# Patient Record
Sex: Male | Born: 1985 | Race: White | Hispanic: No | Marital: Single | Smoking: Current every day smoker
Health system: Southern US, Community
[De-identification: ages and names within clinical notes are randomized; demographics above are authoritative.]

## PROBLEM LIST (undated history)

## (undated) DIAGNOSIS — F1910 Other psychoactive substance abuse, uncomplicated: Secondary | ICD-10-CM

## (undated) DIAGNOSIS — K219 Gastro-esophageal reflux disease without esophagitis: Secondary | ICD-10-CM

## (undated) HISTORY — PX: FINGER SURGERY: SHX640

---
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
## 2008-06-30 ENCOUNTER — Emergency Department (HOSPITAL_COMMUNITY): Admission: EM | Admit: 2008-06-30 | Discharge: 2008-06-30 | Payer: Self-pay | Admitting: Emergency Medicine

## 2008-08-17 ENCOUNTER — Emergency Department (HOSPITAL_COMMUNITY): Admission: EM | Admit: 2008-08-17 | Discharge: 2008-08-17 | Payer: Self-pay | Admitting: Emergency Medicine

## 2009-06-30 ENCOUNTER — Emergency Department (HOSPITAL_COMMUNITY): Admission: EM | Admit: 2009-06-30 | Discharge: 2009-06-30 | Payer: Self-pay

## 2009-10-29 ENCOUNTER — Ambulatory Visit: Payer: Self-pay | Admitting: Diagnostic Radiology

## 2009-10-29 ENCOUNTER — Emergency Department (HOSPITAL_BASED_OUTPATIENT_CLINIC_OR_DEPARTMENT_OTHER): Admission: EM | Admit: 2009-10-29 | Discharge: 2009-10-29 | Payer: Self-pay | Admitting: Emergency Medicine

## 2009-11-05 ENCOUNTER — Emergency Department (HOSPITAL_BASED_OUTPATIENT_CLINIC_OR_DEPARTMENT_OTHER): Admission: EM | Admit: 2009-11-05 | Discharge: 2009-11-05 | Payer: Self-pay | Admitting: Emergency Medicine

## 2009-11-23 ENCOUNTER — Ambulatory Visit: Payer: Self-pay | Admitting: Diagnostic Radiology

## 2009-11-23 ENCOUNTER — Emergency Department (HOSPITAL_BASED_OUTPATIENT_CLINIC_OR_DEPARTMENT_OTHER): Admission: EM | Admit: 2009-11-23 | Discharge: 2009-11-23 | Payer: Self-pay | Admitting: Emergency Medicine
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
## 2011-03-19 LAB — DIFFERENTIAL
Basophils Absolute: 0 10*3/uL (ref 0.0–0.1)
Eosinophils Absolute: 0.1 10*3/uL (ref 0.0–0.7)
Eosinophils Relative: 2 % (ref 0–5)
Monocytes Absolute: 0.5 10*3/uL (ref 0.1–1.0)

## 2011-03-19 LAB — BASIC METABOLIC PANEL
BUN: 12 mg/dL (ref 6–23)
CO2: 32 mEq/L (ref 19–32)
Chloride: 100 mEq/L (ref 96–112)
Glucose, Bld: 65 mg/dL — ABNORMAL LOW (ref 70–99)
Potassium: 3.8 mEq/L (ref 3.5–5.1)

## 2011-03-19 LAB — CULTURE, BLOOD (ROUTINE X 2)

## 2011-03-19 LAB — CBC
HCT: 44.3 % (ref 39.0–52.0)
Hemoglobin: 14.6 g/dL (ref 13.0–17.0)
MCV: 89 fL (ref 78.0–100.0)
Platelets: 315 10*3/uL (ref 150–400)
RDW: 13.1 % (ref 11.5–15.5)

## 2011-03-20 LAB — CBC
MCV: 88 fL (ref 78.0–100.0)
Platelets: 354 10*3/uL (ref 150–400)
RBC: 5.16 MIL/uL (ref 4.22–5.81)
WBC: 8.5 10*3/uL (ref 4.0–10.5)

## 2011-03-20 LAB — BASIC METABOLIC PANEL
BUN: 15 mg/dL (ref 6–23)
Chloride: 101 mEq/L (ref 96–112)
Creatinine, Ser: 0.8 mg/dL (ref 0.4–1.5)
GFR calc Af Amer: >60 mL/min (ref >60)
GFR calc non Af Amer: >60 mL/min (ref >60)
Potassium: 3.8 mEq/L (ref 3.5–5.1)

## 2011-03-20 LAB — DIFFERENTIAL
Eosinophils Absolute: 0.1 10*3/uL (ref 0.0–0.7)
Lymphocytes Relative: 32 % (ref 12–46)
Lymphs Abs: 2.7 10*3/uL (ref 0.7–4.0)
Monocytes Relative: 5 % (ref 3–12)
Neutrophils Relative %: 61 % (ref 43–77)

## 2011-03-24 LAB — POCT I-STAT, CHEM 8
BUN: 6 mg/dL (ref 6–23)
Chloride: 107 meq/L (ref 96–112)
Creatinine, Ser: 1.1 mg/dL (ref 0.4–1.5)
Glucose, Bld: 79 mg/dL (ref 70–99)
Potassium: 3.5 meq/L (ref 3.5–5.1)
Sodium: 143 meq/L (ref 135–145)

## 2011-12-18 ENCOUNTER — Encounter: Payer: Self-pay | Admitting: Gastroenterology

## 2012-01-06 ENCOUNTER — Ambulatory Visit: Payer: Self-pay | Admitting: Gastroenterology

## 2014-08-23 ENCOUNTER — Emergency Department (HOSPITAL_BASED_OUTPATIENT_CLINIC_OR_DEPARTMENT_OTHER)
Admission: EM | Admit: 2014-08-23 | Discharge: 2014-08-23 | Disposition: A | Discharge status: Home / Self Care | Payer: BC Managed Care – PPO | Attending: Emergency Medicine | Admitting: Emergency Medicine

## 2014-08-23 ENCOUNTER — Encounter (HOSPITAL_BASED_OUTPATIENT_CLINIC_OR_DEPARTMENT_OTHER): Payer: Self-pay | Admitting: Emergency Medicine

## 2014-08-23 DIAGNOSIS — S29012A Strain of muscle and tendon of back wall of thorax, initial encounter: Secondary | ICD-10-CM

## 2014-08-23 DIAGNOSIS — IMO0002 Reserved for concepts with insufficient information to code with codable children: Secondary | ICD-10-CM | POA: Insufficient documentation

## 2014-08-23 DIAGNOSIS — Y9389 Activity, other specified: Secondary | ICD-10-CM | POA: Insufficient documentation

## 2014-08-23 DIAGNOSIS — X500XXA Overexertion from strenuous movement or load, initial encounter: Secondary | ICD-10-CM | POA: Diagnosis not present

## 2014-08-23 DIAGNOSIS — R000 Tachycardia, unspecified: Secondary | ICD-10-CM | POA: Diagnosis not present

## 2014-08-23 DIAGNOSIS — F17200 Nicotine dependence, unspecified, uncomplicated: Secondary | ICD-10-CM | POA: Diagnosis not present

## 2014-08-23 DIAGNOSIS — S39012A Strain of muscle, fascia and tendon of lower back, initial encounter: Secondary | ICD-10-CM

## 2014-08-23 DIAGNOSIS — K219 Gastro-esophageal reflux disease without esophagitis: Secondary | ICD-10-CM | POA: Diagnosis not present

## 2014-08-23 DIAGNOSIS — Y929 Unspecified place or not applicable: Secondary | ICD-10-CM | POA: Diagnosis not present

## 2014-08-23 DIAGNOSIS — S239XXA Sprain of unspecified parts of thorax, initial encounter: Secondary | ICD-10-CM | POA: Insufficient documentation

## 2014-08-23 DIAGNOSIS — Z79899 Other long term (current) drug therapy: Secondary | ICD-10-CM | POA: Diagnosis not present

## 2014-08-23 DIAGNOSIS — S335XXA Sprain of ligaments of lumbar spine, initial encounter: Secondary | ICD-10-CM | POA: Diagnosis not present

## 2014-08-23 DIAGNOSIS — S233XXA Sprain of ligaments of thoracic spine, initial encounter: Secondary | ICD-10-CM

## 2014-08-23 HISTORY — DX: Gastro-esophageal reflux disease without esophagitis: K21.9

## 2014-08-23 MED ORDER — TRAMADOL HCL 50 MG PO TABS: 50.0000 mg | ORAL_TABLET | Freq: Four times a day (QID) | ORAL | Status: DC | PRN

## 2014-08-23 MED ORDER — DIAZEPAM 5 MG PO TABS
5.0000 mg | ORAL_TABLET | Freq: Once | ORAL | Status: AC
  Administered 2014-08-23: 5 mg via ORAL
  Filled 2014-08-23: qty 1

## 2014-08-23 MED ORDER — OXYCODONE-ACETAMINOPHEN 5-325 MG PO TABS
1.0000 | ORAL_TABLET | Freq: Once | ORAL | Status: AC
  Administered 2014-08-23: 1 via ORAL
  Filled 2014-08-23: qty 1

## 2014-08-23 MED ORDER — DIAZEPAM 5 MG PO TABS: 5.0000 mg | ORAL_TABLET | Freq: Two times a day (BID) | ORAL | Status: DC | PRN

## 2014-08-23 NOTE — ED Notes (Signed)
Pain in mid back x1 hour while moving furniture.

## 2014-08-23 NOTE — Discharge Instructions (Signed)
Take Valium as needed as directed for muscle spasm. No driving or operating heavy machinery while taking valium. This medication may cause drowsiness. Take Tylenol as directed for pain. Rest, avoid heavy lifting or hard physical activity for the next few days. Apply an ice pack and heating pad intermittently.  Muscle Strain A muscle strain is an injury that occurs when a muscle is stretched beyond its normal length. Usually a small number of muscle fibers are torn when this happens. Muscle strain is rated in degrees. First-degree strains have the least amount of muscle fiber tearing and pain. Second-degree and third-degree strains have increasingly more tearing and pain.  Usually, recovery from muscle strain takes 1-2 weeks. Complete healing takes 5-6 weeks.  CAUSES  Muscle strain happens when a sudden, violent force placed on a muscle stretches it too far. This may occur with lifting, sports, or a fall.  RISK FACTORS Muscle strain is especially common in athletes.  SIGNS AND SYMPTOMS At the site of the muscle strain, there may be:  Pain.  Bruising.  Swelling.  Difficulty using the muscle due to pain or lack of normal function. DIAGNOSIS  Your health care provider will perform a physical exam and ask about your medical history. TREATMENT  Often, the best treatment for a muscle strain is resting, icing, and applying cold compresses to the injured area.  HOME CARE INSTRUCTIONS   Use the PRICE method of treatment to promote muscle healing during the first 2-3 days after your injury. The PRICE method involves:  Protecting the muscle from being injured again.  Restricting your activity and resting the injured body part.  Icing your injury. To do this, put ice in a plastic bag. Place a towel between your skin and the bag. Then, apply the ice and leave it on from 15-20 minutes each hour. After the third day, switch to moist heat packs.  Apply compression to the injured area with a splint  or elastic bandage. Be careful not to wrap it too tightly. This may interfere with blood circulation or increase swelling.  Elevate the injured body part above the level of your heart as often as you can.  Only take over-the-counter or prescription medicines for pain, discomfort, or fever as directed by your health care provider.  Warming up prior to exercise helps to prevent future muscle strains. SEEK MEDICAL CARE IF:   You have increasing pain or swelling in the injured area.  You have numbness, tingling, or a significant loss of strength in the injured area. MAKE SURE YOU:   Understand these instructions.  Will watch your condition.  Will get help right away if you are not doing well or get worse. Document Released: 12/02/2005 Document Revised: 09/22/2013 Document Reviewed: 07/01/2013 [facility] Patient Information 2015 [place], [place]. This information is not intended to replace advice given to you by your health care provider. Make sure you discuss any questions you have with your health care provider.  Lumbosacral Strain Lumbosacral strain is a strain of any of the parts that make up your lumbosacral vertebrae. Your lumbosacral vertebrae are the bones that make up the lower third of your backbone. Your lumbosacral vertebrae are held together by muscles and tough, fibrous tissue (ligaments).  CAUSES  A sudden blow to your back can cause lumbosacral strain. Also, anything that causes an excessive stretch of the muscles in the low back can cause this strain. This is typically seen when people exert themselves strenuously, fall, lift heavy objects, bend, or crouch repeatedly.  RISK FACTORS  Physically demanding work.  Participation in pushing or pulling sports or sports that require a sudden twist of the back (tennis, golf, baseball).  Weight lifting.  Excessive lower back curvature.  Forward-tilted pelvis.  Weak back or abdominal muscles or both.  Tight hamstrings. SIGNS AND  SYMPTOMS  Lumbosacral strain may cause pain in the area of your injury or pain that moves (radiates) down your leg.  DIAGNOSIS Your health care provider can often diagnose lumbosacral strain through a physical exam. In some cases, you may need tests such as X-ray exams.  TREATMENT  Treatment for your lower back injury depends on many factors that your clinician will have to evaluate. However, most treatment will include the use of anti-inflammatory medicines. HOME CARE INSTRUCTIONS   Avoid hard physical activities (tennis, racquetball, waterskiing) if you are not in proper physical condition for it. This may aggravate or create problems.  If you have a back problem, avoid sports requiring sudden body movements. Swimming and walking are generally safer activities.  Maintain good posture.  Maintain a healthy weight.  For acute conditions, you may put ice on the injured area.  Put ice in a plastic bag.  Place a towel between your skin and the bag.  Leave the ice on for 20 minutes, 2-3 times a day.  When the low back starts healing, stretching and strengthening exercises may be recommended. SEEK MEDICAL CARE IF:  Your back pain is getting worse.  You experience severe back pain not relieved with medicines. SEEK IMMEDIATE MEDICAL CARE IF:   You have numbness, tingling, weakness, or problems with the use of your arms or legs.  There is a change in bowel or bladder control.  You have increasing pain in any area of the body, including your belly (abdomen).  You notice shortness of breath, dizziness, or feel faint.  You feel sick to your stomach (nauseous), are throwing up (vomiting), or become sweaty.  You notice discoloration of your toes or legs, or your feet get very cold. MAKE SURE YOU:   Understand these instructions.  Will watch your condition.  Will get help right away if you are not doing well or get worse. Document Released: 09/11/2005 Document Revised: 12/07/2013  Document Reviewed: 07/21/2013 [facility] ([facility] Patient Information 2015 [place], [place]. This information is not intended to replace advice given to you by your health care provider. Make sure you discuss any questions you have with your health care provider.  Thoracic Strain You have injured the muscles or tendons that attach to the upper part of your back behind your chest. This injury is called a thoracic strain, thoracic sprain, or mid-back strain.  CAUSES  The cause of thoracic strain varies. A less severe injury involves pulling a muscle or tendon without tearing it. A more severe injury involves tearing (rupturing) a muscle or tendon. With less severe injuries, there may be little loss of strength. Sometimes, there are breaks (fractures) in the bones to which the muscles are attached. These fractures are rare, unless there was a direct hit (trauma) or you have weak bones due to osteoporosis or age. Longstanding strains may be caused by overuse or improper form during certain movements. Obesity can also increase your risk for back injuries. Sudden strains may occur due to injury or not warming up properly before exercise. Often, there is no obvious cause for a thoracic strain. SYMPTOMS  The main symptom is pain, especially with movement, such as during exercise. DIAGNOSIS  Your caregiver can usually  tell what is wrong by taking an X-ray and doing a physical exam. TREATMENT   Physical therapy may be helpful for recovery. Your caregiver can give you exercises to do or refer you to a physical therapist after your pain improves.  After your pain improves, strengthening and conditioning programs appropriate for your sport or occupation may be helpful.  Always warm up before physical activities or athletics. Stretching after physical activity may also help.  Certain over-the-counter medicines may also help. Ask your caregiver if there are medicines that would help you. If this is your first thoracic  strain injury, proper care and proper healing time before starting activities should prevent long-term problems. Torn ligaments and tendons require as long to heal as broken bones. Average healing times may be only 1 week for a mild strain. For torn muscles and tendons, healing time may be up to 6 weeks to 2 months. HOME CARE INSTRUCTIONS   Apply ice to the injured area. Ice massages may also be used as directed.  Put ice in a plastic bag.  Place a towel between your skin and the bag.  Leave the ice on for 15-20 minutes, 03-04 times a day, for the first 2 days.  Only take over-the-counter or prescription medicines for pain, discomfort, or fever as directed by your caregiver.  Keep your appointments for physical therapy if this was prescribed.  Use wraps and back braces as instructed. SEEK IMMEDIATE MEDICAL CARE IF:   You have an increase in bruising, swelling, or pain.  Your pain has not improved with medicines.  You develop new shortness of breath, chest pain, or fever.  Problems seem to be getting worse rather than better. MAKE SURE YOU:   Understand these instructions.  Will watch your condition.  Will get help right away if you are not doing well or get worse. Document Released: 02/22/2004 Document Revised: 02/24/2012 Document Reviewed: 01/18/2011 [facility] Patient Information 2015 [place], [place]. This information is not intended to replace advice given to you by your health care provider. Make sure you discuss any questions you have with your health care provider.

## 2014-08-23 NOTE — ED Provider Notes (Signed)
CSN: [id number]     Arrival date & time 08/23/14  1640 History   First MD Initiated Contact with Patient 08/23/14 1704     Chief Complaint  Patient presents with  . Back Injury     (Consider location/radiation/quality/duration/timing/severity/associated sxs/prior Treatment) HPI Comments: Patient is a 28 year old male who presents to the emergency department complaining of mid to lower back pain times one hour. 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 He has been constant, worse with any movement, described as throbbing, radiating down his left leg. Denies numbness or tingling radiating down his extremities. No loss of control bowels or bladder or saddle anesthesia. He tried taking Tylenol with no relief of his symptoms.  The history is provided by the patient.    Past Medical History  Diagnosis Date  . GERD (gastroesophageal reflux disease)    Past Surgical History  Procedure Laterality Date  . Finger surgery     No family history on file. History  Substance Use Topics  . Smoking status: Current Every Day Smoker -- 1.00 packs/day  . Smokeless tobacco: Not on file  . Alcohol Use: No    Review of Systems  Musculoskeletal: Positive for back pain.  All other systems reviewed and are negative.     Allergies  Review of patient's allergies indicates no known allergies.  Home Medications   Prior to Admission medications   Medication Sig Start Date End Date Taking? Authorizing Provider  omeprazole (PRILOSEC) 40 MG capsule Take 80 mg by mouth daily.   Yes Historical Provider, MD  diazepam (VALIUM) 5 MG tablet Take 1 tablet (5 mg total) by mouth every 12 (twelve) hours as needed for anxiety. 08/23/14   Trevor Mace, PA-C  traMADol (ULTRAM) 50 MG tablet Take 1 tablet (50 mg total) by mouth every 6 (six) hours as needed. 08/23/14   Trevor Mace, PA-C   BP 121/74  Pulse 111  Temp(Src) 98.7 F (37.1 C) (Oral)  Resp 20   Ht 6' (1.829 m)  Wt 190 lb (86.183 kg)  BMI 25.76 kg/m2  SpO2 98% Physical Exam  Nursing note and vitals reviewed. Constitutional: He is oriented to person, place, and time. He appears well-developed and well-nourished. No distress.  Uncomfortable but in NAD.  HENT:  Head: Normocephalic and atraumatic.  Mouth/Throat: Oropharynx is clear and moist.  Eyes: Conjunctivae are normal.  Neck: Normal range of motion. Neck supple. No spinous process tenderness and no muscular tenderness present.  Cardiovascular: Regular rhythm and normal heart sounds.   Tachycardic.  Pulmonary/Chest: Effort normal and breath sounds normal. No respiratory distress.  Musculoskeletal: He exhibits no edema.  Tender to palpation left thoracic and lumbar paraspinal muscles without spasm. No spinous process tenderness. Range of motion limited by pain.  Neurological: He is alert and oriented to person, place, and time. He has normal strength.  Strength lower extremities 5/5 and equal bilateral. Sensation intact. Normal gait.  Skin: Skin is warm and dry. No rash noted. He is not diaphoretic.  Psychiatric: He has a normal mood and affect. His behavior is normal.    ED Course  Procedures (including critical care time) Labs Review Labs Reviewed - No data to display  Imaging Review No results found.   EKG Interpretation None      MDM   Final diagnoses:  Lumbar strain, initial encounter  Thoracic sprain and strain, initial encounter   Patient presented with  back pain after a mechanical injury. He is nontoxic appearing, uncomfortable, but in NAD. No red flags concerning patient's back pain. No s/s of central cord compression or cauda equina. Lower extremities are neurovascularly intact and patient is ambulating without difficulty. No bony tenderness. Treat with valium, tramadol. Advised ice/heat, rest. Stable for d/c. Return precautions given. Patient states understanding of treatment care plan and is  agreeable.  Trevor Mace, PA-C 08/23/14 1722

## 2014-08-25 NOTE — ED Provider Notes (Signed)
Medical screening examination/treatment/procedure(s) were performed by non-physician practitioner and as supervising physician I was immediately available for consultation/collaboration.   EKG Interpretation None         Mirian Mo, MD 08/25/14 1234

## 2014-12-08 ENCOUNTER — Emergency Department (HOSPITAL_COMMUNITY)
Admission: EM | Admit: 2014-12-08 | Discharge: 2014-12-08 | Disposition: A | Discharge status: Home / Self Care | Payer: BC Managed Care – PPO | Attending: Emergency Medicine | Admitting: Emergency Medicine

## 2014-12-08 ENCOUNTER — Encounter (HOSPITAL_COMMUNITY): Payer: Self-pay | Admitting: Emergency Medicine

## 2014-12-08 ENCOUNTER — Emergency Department (HOSPITAL_COMMUNITY): Payer: BC Managed Care – PPO

## 2014-12-08 DIAGNOSIS — F419 Anxiety disorder, unspecified: Secondary | ICD-10-CM | POA: Diagnosis not present

## 2014-12-08 DIAGNOSIS — R05 Cough: Secondary | ICD-10-CM | POA: Insufficient documentation

## 2014-12-08 DIAGNOSIS — R55 Syncope and collapse: Secondary | ICD-10-CM | POA: Diagnosis not present

## 2014-12-08 DIAGNOSIS — K219 Gastro-esophageal reflux disease without esophagitis: Secondary | ICD-10-CM | POA: Diagnosis not present

## 2014-12-08 DIAGNOSIS — R61 Generalized hyperhidrosis: Secondary | ICD-10-CM | POA: Diagnosis not present

## 2014-12-08 DIAGNOSIS — R0789 Other chest pain: Secondary | ICD-10-CM

## 2014-12-08 DIAGNOSIS — Z720 Tobacco use: Secondary | ICD-10-CM | POA: Diagnosis not present

## 2014-12-08 DIAGNOSIS — Z79899 Other long term (current) drug therapy: Secondary | ICD-10-CM | POA: Diagnosis not present

## 2014-12-08 DIAGNOSIS — R079 Chest pain, unspecified: Secondary | ICD-10-CM | POA: Diagnosis present

## 2014-12-08 LAB — I-STAT TROPONIN, ED: TROPONIN I, POC: 0.01 ng/mL (ref 0.00–0.08)

## 2014-12-08 LAB — BASIC METABOLIC PANEL
ANION GAP: 10 (ref 5–15)
BUN: 14 mg/dL (ref 6–23)
CHLORIDE: 102 meq/L (ref 96–112)
CO2: 27 mmol/L (ref 19–32)
CREATININE: 0.95 mg/dL (ref 0.50–1.35)
Calcium: 10.2 mg/dL (ref 8.4–10.5)
GFR calc non Af Amer: >90 mL/min (ref >90)
Glucose, Bld: 106 mg/dL — ABNORMAL HIGH (ref 70–99)
POTASSIUM: 4 mmol/L (ref 3.5–5.1)
Sodium: 139 mmol/L (ref 135–145)

## 2014-12-08 LAB — CBC
HCT: 49.2 % (ref 39.0–52.0)
Hemoglobin: 15.7 g/dL (ref 13.0–17.0)
MCH: 27.7 pg (ref 26.0–34.0)
MCHC: 31.9 g/dL (ref 30.0–36.0)
MCV: 86.9 fL (ref 78.0–100.0)
PLATELETS: 295 10*3/uL (ref 150–400)
RBC: 5.66 MIL/uL (ref 4.22–5.81)
RDW: 13.5 % (ref 11.5–15.5)
WBC: 7.5 10*3/uL (ref 4.0–10.5)

## 2014-12-08 LAB — BRAIN NATRIURETIC PEPTIDE: B Natriuretic Peptide: 12.8 pg/mL (ref 0.0–100.0)

## 2014-12-08 MED ORDER — LORAZEPAM 2 MG/ML IJ SOLN
1.0000 mg | Freq: Once | INTRAMUSCULAR | Status: AC
  Administered 2014-12-08: 1 mg via INTRAVENOUS
  Filled 2014-12-08: qty 1

## 2014-12-08 MED ORDER — HYDROCODONE-ACETAMINOPHEN 5-325 MG PO TABS
1.0000 | ORAL_TABLET | Freq: Once | ORAL | Status: AC
  Administered 2014-12-08: 1 via ORAL
  Filled 2014-12-08: qty 1

## 2014-12-08 MED ORDER — HYDROCODONE-ACETAMINOPHEN 5-325 MG PO TABS: 1.0000 | ORAL_TABLET | Freq: Four times a day (QID) | ORAL | Status: DC | PRN

## 2014-12-08 MED ORDER — KETOROLAC TROMETHAMINE 30 MG/ML IJ SOLN
30.0000 mg | Freq: Once | INTRAMUSCULAR | Status: AC
  Administered 2014-12-08: 30 mg via INTRAVENOUS
  Filled 2014-12-08: qty 1

## 2014-12-08 MED ORDER — LORAZEPAM 1 MG PO TABS: 1.0000 mg | ORAL_TABLET | Freq: Three times a day (TID) | ORAL | Status: DC | PRN

## 2014-12-08 MED ORDER — NAPROXEN 500 MG PO TABS: 500.0000 mg | ORAL_TABLET | Freq: Two times a day (BID) | ORAL | Status: DC | PRN

## 2014-12-08 NOTE — Discharge Instructions (Signed)
Your chest pain appears to be related to an anxiety attack. Use ativan as directed as needed for any future anxiety attacks such as today's event. Use heat over the area of pain on your chest for 20 minutes at a time every hour, and try naprosyn to help with your pain. Use the list below to find a psychiatrist to help with anxiety in the future. Return to the ER for changes or worsening symptoms. STOP SMOKING!   Chest Wall Pain Chest wall pain is pain in or around the bones and muscles of your chest. It may take up to 6 weeks to get better. It may take longer if you must stay physically active in your work and activities.  CAUSES  Chest wall pain may happen on its own. However, it may be caused by:  A viral illness like the flu.  Injury.  Coughing.  Exercise.  Arthritis.  Fibromyalgia.  Shingles. HOME CARE INSTRUCTIONS   Avoid overtiring physical activity. Try not to strain or perform activities that cause pain. This includes any activities using your chest or your abdominal and side muscles, especially if heavy weights are used.  Put ice on the sore area.  Put ice in a plastic bag.  Place a towel between your skin and the bag.  Leave the ice on for 15-20 minutes per hour while awake for the first 2 days.  Only take over-the-counter or prescription medicines for pain, discomfort, or fever as directed by your caregiver. SEEK IMMEDIATE MEDICAL CARE IF:   Your pain increases, or you are very uncomfortable.  You have a fever.  Your chest pain becomes worse.  You have new, unexplained symptoms.  You have nausea or vomiting.  You feel sweaty or lightheaded.  You have a cough with phlegm (sputum), or you cough up blood. MAKE SURE YOU:   Understand these instructions.  Will watch your condition.  Will get help right away if you are not doing well or get worse. Document Released: 12/02/2005 Document Revised: 02/24/2012 Document Reviewed: 07/29/2011 [facility] Patient  Information 2015 [place], [place]. This information is not intended to replace advice given to you by your health care provider. Make sure you discuss any questions you have with your health care provider.  Panic Attacks Panic attacks are sudden, short-livedsurges of severe anxiety, fear, or discomfort. They may occur for no reason when you are relaxed, when you are anxious, or when you are sleeping. Panic attacks may occur for a number of reasons:   Healthy people occasionally have panic attacks in extreme, life-threatening situations, such as war or natural disasters. Normal anxiety is a protective mechanism of the body that helps us react to danger (fight or flight response).  Panic attacks are often seen with anxiety disorders, such as panic disorder, social anxiety disorder, generalized anxiety disorder, and phobias. Anxiety disorders cause excessive or uncontrollable anxiety. They may interfere with your relationships or other life activities.  Panic attacks are sometimes seen with other mental illnesses, such as depression and posttraumatic stress disorder.  Certain medical conditions, prescription medicines, and drugs of abuse can cause panic attacks. SYMPTOMS  Panic attacks start suddenly, peak within 20 minutes, and are accompanied by four or more of the following symptoms:  Pounding heart or fast heart rate (palpitations).  Sweating.  Trembling or shaking.  Shortness of breath or feeling smothered.  Feeling choked.  Chest pain or discomfort.  Nausea or strange feeling in your stomach.  Dizziness, light-headedness, or feeling like you will faint.  Chills or hot flushes.  Numbness or tingling in your lips or hands and feet.  Feeling that things are not real or feeling that you are not yourself.  Fear of losing control or going crazy.  Fear of dying. Some of these symptoms can mimic serious medical conditions. For example, you may think you are having a heart attack.  Although panic attacks can be very scary, they are not life threatening. DIAGNOSIS  Panic attacks are diagnosed through an assessment by your health care provider. Your health care provider will ask questions about your symptoms, such as where and when they occurred. Your health care provider will also ask about your medical history and use of alcohol and drugs, including prescription medicines. Your health care provider may order blood tests or other studies to rule out a serious medical condition. Your health care provider may refer you to a mental health professional for further evaluation. TREATMENT   Most healthy people who have one or two panic attacks in an extreme, life-threatening situation will not require treatment.  The treatment for panic attacks associated with anxiety disorders or other mental illness typically involves counseling with a mental health professional, medicine, or a combination of both. Your health care provider will help determine what treatment is best for you.  Panic attacks due to physical illness usually go away with treatment of the illness. If prescription medicine is causing panic attacks, talk with your health care provider about stopping the medicine, decreasing the dose, or substituting another medicine.  Panic attacks due to alcohol or drug abuse go away with abstinence. Some adults need professional help in order to stop drinking or using drugs. HOME CARE INSTRUCTIONS   Take all medicines as directed by your health care provider.   Schedule and attend follow-up visits as directed by your health care provider. It is important to keep all your appointments. SEEK MEDICAL CARE IF:  You are not able to take your medicines as prescribed.  Your symptoms do not improve or get worse. SEEK IMMEDIATE MEDICAL CARE IF:   You experience panic attack symptoms that are different than your usual symptoms.  You have serious thoughts about hurting yourself or  others.  You are taking medicine for panic attacks and have a serious side effect. MAKE SURE YOU:  Understand these instructions.  Will watch your condition.  Will get help right away if you are not doing well or get worse. Document Released: 12/02/2005 Document Revised: 12/07/2013 Document Reviewed: 07/16/2013 [employer] [employer] Patient Information 2015 [place], [place]. This information is not intended to replace advice given to you by your health care provider. Make sure you discuss any questions you have with your health care provider. Emergency Department Resource Guide 1) Find a Doctor and Pay Out of Pocket Although you won't have to find out who is covered by your insurance plan, it is a good idea to ask around and get recommendations. You will then need to call the office and see if the doctor you have chosen will accept you as a new patient and what types of options they offer for patients who are self-pay. Some doctors offer discounts or will set up payment plans for their patients who do not have insurance, but you will need to ask so you aren't surprised when you get to your appointment.  2) Contact Your Local Health Department Not all health departments have doctors that can see patients for sick visits, but many do, so it is worth a call to see  if yours does. If you don't know where your local health department is, you can check in your phone book. The CDC also has a tool to help you locate your state's health department, and many state websites also have listings of all of their local health departments.  3) Find a Walk-in Clinic If your illness is not likely to be very severe or complicated, you may want to try a walk in clinic. These are popping up all over the country in pharmacies, drugstores, and shopping centers. They're usually staffed by nurse practitioners or physician assistants that have been trained to treat common illnesses and complaints. They're usually fairly quick and  inexpensive. However, if you have serious medical issues or chronic medical problems, these are probably not your best option.  No Primary Care Doctor: - Call Health Connect at  906-307-9265 - they can help you locate a primary care doctor that  accepts your insurance, provides certain services, etc. - Physician Referral Service- [phone]  Chronic Pain Problems: Organization         Address  Phone   Notes  [facility]  [phone] Patients need to be referred by their primary care doctor.   Medication Assistance: Organization         Address  Phone   Notes  [facility] Medication [facility] [address] [place], [place] [address] [phone] --Must be a resident of [facility] -- Must have NO insurance coverage whatsoever (no Medicaid/ Medicare, etc.) -- The pt. MUST have a primary care doctor that directs their care regularly and follows them in the community   [facility]ist  478-004-1318   [employer]  978-544-3177    Behavioral Health Resources in the Community: Intensive Outpatient Programs Organization         Address  Phone  Notes  [facility] [address] [place], [place] [phone]   [facility] Outpatient [address] [place], [place] [phone]   ADS: [facility] [address], [place], [place]  [phone]   [facility] Mental Health [address],  [place], [place] [phone] or 8022446354   Substance Abuse Resources Organization         Address  Phone  Notes  [facility] Drug Services  (737)[phone]   [facility]  4797899033   [facility]  (306)358-1245   Floydene Flock  747-039-7979   Residential & Outpatient Substance Abuse Program  980-275-3924   Psychological Services Organization         Address  Phone  Notes  [facility] Health  [phone]   [facility]  ([phone]   [facility] [address], [place] [phone] or 606-680-0813    Mobile Crisis Teams Organization         Address  Phone  Notes  [facility], Mobile Crisis Care Unit  351-579-7663   [facility]  [address] [place], [place] [phone]   Doristine Locks [address] [place] [phone]    Self-Help/Support Groups Organization         Address  Phone             Notes  [facility] [place] - variety of support groups  336- I7437963 Call for more information  Narcotics Anonymous (NA), [facility] [address], [employer] [place]  2 meetings at this location  Residential Treatment Programs Organization         Address  Phone  Notes  [facility] [address],    [place]  [phone]   [facility]  [address] [place] [address], [place], [place] [phone]   [facility] [address] [place], [place] [phone] Admissions: 8am-3pm M-F  [facility] [address],    [place], [place] [phone]   [facility] [address] [place], [place], [place] [phone]   [facility] [address]  [place], [place] [phone]   [facility] - Intensive Outpatient [address], [place], [place] [phone]   [facility] ([facility] [address] [place].,  [place], [place] 3-295-188-4166 or [phone]   [facility] ([facility]) [address], [place], [place] [phone] Accepts Medicaid  [facility] [place] [address]  [place] [phone] Substance Abuse/Addiction Treatment   [facility] Organization         Address  Phone  Notes  [facility]  639-523-8230   Angie Fava, PhD [address] Ervin Knack [place], [place]   [phone] or ([phone])[phone]   [facility] Behavioral   [address] [place], [place] [phone]   [facility] [address] [place], [place] [phone] Insurance/Medicaid/sponsorship through [facility] [address]                                    [place], [place] [phone] Therapy/tele-psych/case  [facility] [address], [place] [phone]    Dr. Lolly Mustache  [phone]   [facility]  [employer] [facility] Dept. 1) [address] [place] 2) [address], [place] 3)  [address], [place] [phone]  [phone]   [facility] Child Abuse Hotline (236)347-8224 or 774-627-8414 (After Hours)      Behavioral Health Resources in the [facility]  Intensive Outpatient Programs: [facility]      [address] [place], [place] [phone] Both a day and evening program       [facility] Outpatient     [address]        [place], [place] [address] [phone]         [facility]: [facility] [address] [place] [phone]  [facility] ACCESS LINE: 513-616-6530 or 4143427467 [address] [place], [place] [address] [email or web address]  Mobile Crisis Teams:                                        [facility]         Mobile Crisis Care Unit 2480590286             [facility] [address] Ginette Otto [phone]                                         [facility] [address] [place] [phone]  Self-Help/Support  Groups: Mental Health Assoc. of [place] Variety of support groups [phone] (call for more info)  Narcotics Anonymous (NA) [facility] [address] [place] - 2 meetings at this location  Residential Treatment Programs:  [facility]      [address]        [place]       [phone]         [facility] [address], [place] [address]27[address]107118 [place], [place]  [phone]  [facility]  [address] [place], [place] [address] [phone] Admissions: 8am-3pm M-F  [facility]     [address]        [place], [place] [address]       [phone]         [facility] [address] [place], [place] [phone]  [facility] [address] [place], [place] [phone]  [facility] - Intensive Outpatient      [address]     [place], [place]       [phone]         [facility] ([facility]     [address] [place], [place] [phone] or [phone]  [facility] ([facility])  [address] [place], [place] [phone]  [facility] [address] [place] [address]                                               [address] [place] [phone]  [facility] Resources: [phone]- [phone]               General Therapy                                                Angie FavaJulie Brannon, PhD        [address]                                       [place], [place] [address]         [phone]   Insurance  [facility]   [address]ord[address] Hisle [place], [place] [address] [phone]  [facility] [address] [place], [place] [address] [phone] Insurance/Medicaid/sponsorship through [facility] Families                                              [address]                                        [place], [place] [address]    Therapy/tele-psych/case         [phone]          [facility] [address]   , [place]  [id number]  Adolescent/group home/case management [phone]  Creola Corn PhD       General therapy       Insurance   [id number]         Dr. Lolly Mustache Insurance [phone] M-F  [place] [facility] Medicaid, sponsorship [phone]
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
## 2014-12-08 NOTE — ED Provider Notes (Signed)
CSN: [id number]     Arrival date & time 12/08/14  1506 History   First MD Initiated Contact with Patient 12/08/14 1550     Chief Complaint  Patient presents with  . Chest Pain  . Dizziness     (Consider location/radiation/quality/duration/timing/severity/associated sxs/prior Treatment) HPI Comments: Bernard Santana is a 28 y.o. male with a PMHx of GERD and anxiety, who presents to the ED with complaints of L sided chest pain that began approx 1.5hrs PTA. Pt was sitting in the car driving to [place], and began to experience 6/10 constant sharp/stabbing nonradiating CP with no known aggravating or alleviating factors. Did not try anything PTA. Reports associated pre-syncope feeling/lightheadedness, diaphoresis, b/l arm paresthesias, and feeling of SOB. All his symptoms are improving since arrival. Pt states he's had anxiety his whole life and "hasn't ever been evaluated for it". Doesn't think he was feeling anxious but his mother reports she thought he was getting slightly upset prior to having his symptoms. Denies fevers, chills, DOE, ongoing SOB, cough, hemoptysis, wheezing, chest tightness or pressure, radiation to left arm, jaw or back, abd pain, nausea/emesis, diarrhea, constipation, indigestion/reflux, dysuria, hematuria, myalgias, arthralgias, numbness, weakness, or focal neuro deficits. Denies claudication or LE swelling. No recent travel, denies illicit drug use although has a hx of cocaine abuse last yr, went to rehab. No FHx of DVT/PE. +FHx of CAD in grandmother.    Patient is a 28 y.o. male presenting with chest pain. The history is provided by the patient. No language interpreter was used.  Chest Pain Pain location:  L chest Pain quality: sharp   Pain radiates to:  Does not radiate Pain radiates to the back: no   Pain severity:  Moderate (6/10) Onset quality:  Sudden Duration:  2 hours Timing:  Constant Progression:  Partially resolved Chronicity:  New Context: stress   Relieved by:   None tried Worsened by:  Nothing tried Ineffective treatments:  None tried Associated symptoms: anxiety, cough, diaphoresis, near-syncope and shortness of breath (intermittent)   Associated symptoms: no abdominal pain, no altered mental status, no back pain, no claudication, no dizziness, no fever, no headache, no heartburn, no lower extremity edema, no nausea, no numbness, no orthopnea, no palpitations, no PND, no syncope, not vomiting and no weakness   Risk factors: male sex and smoking   Risk factors: no hypertension and no prior DVT/PE     Past Medical History  Diagnosis Date  . GERD (gastroesophageal reflux disease)    Past Surgical History  Procedure Laterality Date  . Finger surgery     No family history on file. History  Substance Use Topics  . Smoking status: Current Every Day Smoker -- 1.00 packs/day    Types: Cigarettes  . Smokeless tobacco: Not on file  . Alcohol Use: No    Review of Systems  Constitutional: Positive for diaphoresis. Negative for fever and chills.  HENT: Negative for congestion and sore throat.   Respiratory: Positive for cough and shortness of breath (intermittent). Negative for wheezing.   Cardiovascular: Positive for chest pain (L sided) and near-syncope. Negative for palpitations, orthopnea, claudication, leg swelling, syncope and PND.  Gastrointestinal: Negative for heartburn, nausea, vomiting, abdominal pain, diarrhea and constipation.  Genitourinary: Negative for dysuria and hematuria.  Musculoskeletal: Negative for myalgias, back pain and arthralgias.  Skin: Negative for color change.  Neurological: Positive for light-headedness. Negative for dizziness, weakness, numbness and headaches.       +paresthesias B/L upper extremities, resolving  Psychiatric/Behavioral: Negative for  confusion. The patient is nervous/anxious.    10 Systems reviewed and are negative for acute change except as noted in the HPI.    Allergies  Review of patient's  allergies indicates no known allergies.  Home Medications   Prior to Admission medications   Medication Sig Start Date End Date Taking? Authorizing Provider  diazepam (VALIUM) 5 MG tablet Take 1 tablet (5 mg total) by mouth every 12 (twelve) hours as needed for anxiety. 08/23/14   Robyn M Hess, PA-C  omeprazole (PRILOSEC) 40 MG capsule Take 80 mg by mouth daily.    Historical Provider, MD  traMADol (ULTRAM) 50 MG tablet Take 1 tablet (50 mg total) by mouth every 6 (six) hours as needed. 08/23/14   Robyn M Hess, PA-C   BP 139/99 mmHg  Pulse 91  Temp(Src) 97.9 F (36.6 C) (Oral)  Resp 22  SpO2 100% Physical Exam  Constitutional: He is oriented to person, place, and time. Vital signs are normal. He appears well-developed and well-nourished.  Non-toxic appearance. No distress.  Afebrile, nontoxic, NAD, slightly anxious  HENT:  Head: Normocephalic and atraumatic.  Mouth/Throat: Oropharynx is clear and moist and mucous membranes are normal.  Eyes: Conjunctivae and EOM are normal. Right eye exhibits no discharge. Left eye exhibits no discharge.  Neck: Normal range of motion. Neck supple.  Cardiovascular: Normal rate, regular rhythm, normal heart sounds and intact distal pulses.  Exam reveals no gallop and no friction rub.   No murmur heard. RRR, nl s1/s2, no m/r/g, distal pulses intact, no pedal edema  Pulmonary/Chest: Effort normal and breath sounds normal. No respiratory distress. He has no decreased breath sounds. He has no wheezes. He has no rhonchi. He has no rales. He exhibits tenderness. He exhibits no crepitus, no deformity and no retraction.    CTAB in all lung fields, no w/r/r TTP over L chest wall anteriorly, no crepitus or deformity  Abdominal: Soft. Normal appearance and bowel sounds are normal. He exhibits no distension. There is no tenderness. There is no rigidity, no rebound, no guarding, no CVA tenderness, no tenderness at McBurney's point and negative Murphy's sign.  Soft,  NTND, +BS throughout, no r/g/r, neg murphy's, neg mcburney's, no CVA TTP   Musculoskeletal: Normal range of motion.  MAE x4 Strength 5/5 in all extremities Sensation grossly intact in all extremities  Neurological: He is alert and oriented to person, place, and time. He has normal strength. No sensory deficit.  Skin: Skin is warm, dry and intact. No rash noted.  Psychiatric: His mood appears anxious.  Slightly anxious  Nursing note and vitals reviewed.   ED Course  Procedures (including critical care time) Labs Review Labs Reviewed  BASIC METABOLIC PANEL - Abnormal; Notable for the following:    Glucose, Bld 106 (*)    All other components within normal limits  CBC  BRAIN NATRIURETIC PEPTIDE  I-STAT TROPOININ, ED    Imaging Review Dg Chest Port 1 View  12/08/2014   CLINICAL DATA:  Fever and chills.  Mid to low back pain.  EXAM: PORTABLE CHEST - 1 VIEW  COMPARISON:  06/30/2009  FINDINGS: The heart size and mediastinal contours are within normal limits. Both lungs are clear. The visualized skeletal structures are unremarkable.  IMPRESSION: No active disease.   Electronically Signed   By: Elberta Fortisaniel  Boyle M.D.   On: 12/08/2014 15:30     EKG Interpretation   Date/Time:  Thursday December 08 2014 15:11:01 EST Ventricular Rate:  93 PR Interval:  162  QRS Duration: 90 QT Interval:  368 QTC Calculation: 458 R Axis:   67 Text Interpretation:  Sinus rhythm Probable left atrial enlargement ST  elev, probable normal early repol pattern Sinus rhythm Non-specific  intra-ventricular conduction delay ST-t wave abnormality Abnormal ekg  Confirmed by Gerhard Munch  MD [phone]) on 12/08/2014 4:18:03 PM      MDM   Final diagnoses:  Atypical chest pain  Chest wall pain  Anxiety    28 y.o. male with reproducible CP, some component of anxiety suspected. Will give toradol and ativan now. EKG unremarkable, CXR unremarkable, trop neg. CBC and BMP pending. Doubt PE/DVT/ACS but will await  labs. Doubt need for D-dimer. Will reassess shortly.   5:43 PM CBC unremarkable. BMP unremarkable. BNP WNL. Symptoms overall improved, still having slight discomfort in L chest but improved overall. Will give vicodin by mouth and d/c with small supply of ativan for use only if he has an attack like this again, also give small supply of vicodin but discussed use of naprosyn. Smoking cessation discussed. Will give behavioral health resources for outpt f/up. I explained the diagnosis and have given explicit precautions to return to the ER including for any other new or worsening symptoms. The patient understands and accepts the medical plan as it's been dictated and I have answered their questions. Discharge instructions concerning home care and prescriptions have been given. The patient is STABLE and is discharged to home in good condition.  BP 129/68 mmHg  Pulse 81  Temp(Src) 97.9 F (36.6 C) (Oral)  Resp 16  SpO2 100%  Meds ordered this encounter  Medications  . LORazepam (ATIVAN) injection 1 mg    Sig:   . ketorolac (TORADOL) 30 MG/ML injection 30 mg    Sig:   . HYDROcodone-acetaminophen (NORCO/VICODIN) 5-325 MG per tablet 1 tablet    Sig:   . HYDROcodone-acetaminophen (NORCO) 5-325 MG per tablet    Sig: Take 1 tablet by mouth every 6 (six) hours as needed for severe pain.    Dispense:  6 tablet    Refill:  0    Order Specific Question:  Supervising Provider    Answer:  Eber Hong D [[id number]]  . LORazepam (ATIVAN) 1 MG tablet    Sig: Take 1 tablet (1 mg total) by mouth every 8 (eight) hours as needed for anxiety.    Dispense:  6 tablet    Refill:  0    Order Specific Question:  Supervising Provider    Answer:  Eber Hong D [[id number]]  . naproxen (NAPROSYN) 500 MG tablet    Sig: Take 1 tablet (500 mg total) by mouth 2 (two) times daily as needed for mild pain, moderate pain or headache (TAKE WITH MEALS.).    Dispense:  20 tablet    Refill:  0    Order Specific Question:   Supervising Provider    Answer:  Vida Roller [address] [place], PA-C 12/08/14 1749  Gerhard Munch, MD 12/08/14 2103

## 2015-01-23 ENCOUNTER — Encounter (HOSPITAL_COMMUNITY): Payer: Self-pay | Admitting: Emergency Medicine

## 2015-01-23 ENCOUNTER — Emergency Department (HOSPITAL_COMMUNITY)
Admission: EM | Admit: 2015-01-23 | Discharge: 2015-01-23 | Disposition: A | Discharge status: Home / Self Care | Payer: BLUE CROSS/BLUE SHIELD | Attending: Emergency Medicine | Admitting: Emergency Medicine

## 2015-01-23 ENCOUNTER — Emergency Department (HOSPITAL_COMMUNITY): Payer: BLUE CROSS/BLUE SHIELD

## 2015-01-23 DIAGNOSIS — K219 Gastro-esophageal reflux disease without esophagitis: Secondary | ICD-10-CM | POA: Insufficient documentation

## 2015-01-23 DIAGNOSIS — Z720 Tobacco use: Secondary | ICD-10-CM | POA: Insufficient documentation

## 2015-01-23 DIAGNOSIS — R079 Chest pain, unspecified: Secondary | ICD-10-CM | POA: Diagnosis present

## 2015-01-23 DIAGNOSIS — F419 Anxiety disorder, unspecified: Secondary | ICD-10-CM | POA: Insufficient documentation

## 2015-01-23 DIAGNOSIS — Z79899 Other long term (current) drug therapy: Secondary | ICD-10-CM | POA: Insufficient documentation

## 2015-01-23 DIAGNOSIS — R0789 Other chest pain: Secondary | ICD-10-CM | POA: Insufficient documentation

## 2015-01-23 LAB — CBC
HCT: 49.4 % (ref 39.0–52.0)
HEMOGLOBIN: 16.8 g/dL (ref 13.0–17.0)
MCH: 29.7 pg (ref 26.0–34.0)
MCHC: 34 g/dL (ref 30.0–36.0)
MCV: 87.4 fL (ref 78.0–100.0)
Platelets: 340 10*3/uL (ref 150–400)
RBC: 5.65 MIL/uL (ref 4.22–5.81)
RDW: 12.8 % (ref 11.5–15.5)
WBC: 7.6 10*3/uL (ref 4.0–10.5)

## 2015-01-23 LAB — BASIC METABOLIC PANEL
Anion gap: 9 (ref 5–15)
BUN: 11 mg/dL (ref 6–23)
CALCIUM: 9.8 mg/dL (ref 8.4–10.5)
CO2: 28 mmol/L (ref 19–32)
Chloride: 104 mmol/L (ref 96–112)
Creatinine, Ser: 1.07 mg/dL (ref 0.50–1.35)
GFR calc non Af Amer: >90 mL/min (ref >90)
GLUCOSE: 88 mg/dL (ref 70–99)
Potassium: 3.5 mmol/L (ref 3.5–5.1)
Sodium: 141 mmol/L (ref 135–145)

## 2015-01-23 LAB — I-STAT TROPONIN, ED: TROPONIN I, POC: 0 ng/mL (ref 0.00–0.08)

## 2015-01-23 LAB — BRAIN NATRIURETIC PEPTIDE: B NATRIURETIC PEPTIDE 5: 8.8 pg/mL (ref 0.0–100.0)

## 2015-01-23 MED ORDER — LORAZEPAM 1 MG PO TABS: 1.0000 mg | ORAL_TABLET | Freq: Three times a day (TID) | ORAL | Status: DC | PRN

## 2015-01-23 MED ORDER — LORAZEPAM 1 MG PO TABS
1.0000 mg | ORAL_TABLET | Freq: Once | ORAL | Status: AC
  Administered 2015-01-23: 1 mg via ORAL
  Filled 2015-01-23: qty 1

## 2015-01-23 NOTE — ED Provider Notes (Addendum)
CSN: [id number]     Arrival date & time 01/23/15  1641 History   First MD Initiated Contact with Patient 01/23/15 2034     Chief Complaint  Patient presents with  . Chest Pain     (Consider location/radiation/quality/duration/timing/severity/associated sxs/prior Treatment) Patient is a 29 y.o. male presenting with chest pain. The history is provided by the patient.  Chest Pain Pain location:  L chest Pain quality: dull   Pain radiates to:  Does not radiate Pain radiates to the back: yes   Pain severity:  Moderate Onset quality:  Gradual Duration:  5 days Timing:  Constant Progression:  Unchanged Chronicity:  Recurrent Context: stress   Relieved by:  Nothing Worsened by:  Nothing tried Ineffective treatments:  None tried Associated symptoms: no abdominal pain, no cough, no fever, no headache, no nausea, no numbness, no shortness of breath and not vomiting     Past Medical History  Diagnosis Date  . GERD (gastroesophageal reflux disease)    Past Surgical History  Procedure Laterality Date  . Finger surgery     History reviewed. No pertinent family history. History  Substance Use Topics  . Smoking status: Current Every Day Smoker -- 1.00 packs/day    Types: Cigarettes  . Smokeless tobacco: Not on file  . Alcohol Use: No    Review of Systems  Constitutional: Negative for fever.  HENT: Negative for drooling and rhinorrhea.   Eyes: Negative for pain.  Respiratory: Negative for cough and shortness of breath.   Cardiovascular: Positive for chest pain. Negative for leg swelling.  Gastrointestinal: Negative for nausea, vomiting, abdominal pain and diarrhea.  Genitourinary: Negative for dysuria and hematuria.  Musculoskeletal: Negative for gait problem and neck pain.  Skin: Negative for color change.  Neurological: Negative for numbness and headaches.  Hematological: Negative for adenopathy.  Psychiatric/Behavioral: Negative for behavioral problems.  All other systems  reviewed and are negative.     Allergies  Review of patient's allergies indicates no known allergies.  Home Medications   Prior to Admission medications   Medication Sig Start Date End Date Taking? Authorizing Provider  omeprazole (PRILOSEC) 40 MG capsule Take 40 mg by mouth 2 (two) times daily.    Yes Historical Provider, MD  diazepam (VALIUM) 5 MG tablet Take 1 tablet (5 mg total) by mouth every 12 (twelve) hours as needed for anxiety. Patient not taking: Reported on 12/08/2014 08/23/14   Kathrynn Speedobyn M Hess, PA-C  HYDROcodone-acetaminophen (NORCO) 5-325 MG per tablet Take 1 tablet by mouth every 6 (six) hours as needed for severe pain. 12/08/14   Mercedes Strupp Camprubi-Soms, PA-C  LORazepam (ATIVAN) 1 MG tablet Take 1 tablet (1 mg total) by mouth every 8 (eight) hours as needed for anxiety. 12/08/14   Mercedes Strupp Camprubi-Soms, PA-C  naproxen (NAPROSYN) 500 MG tablet Take 1 tablet (500 mg total) by mouth 2 (two) times daily as needed for mild pain, moderate pain or headache (TAKE WITH MEALS.). Patient not taking: Reported on 01/23/2015 12/08/14   Donnita FallsMercedes Strupp Camprubi-Soms, PA-C  traMADol (ULTRAM) 50 MG tablet Take 1 tablet (50 mg total) by mouth every 6 (six) hours as needed. Patient not taking: Reported on 12/08/2014 08/23/14   Nada Boozerobyn M Hess, PA-C   BP 132/80 mmHg  Pulse 85  Temp(Src) 97.9 F (36.6 C) (Oral)  Resp 20  SpO2 100% Physical Exam  Constitutional: He is oriented to person, place, and time. He appears well-developed and well-nourished.  HENT:  Head: Normocephalic and atraumatic.  Right Ear:  External ear normal.  Left Ear: External ear normal.  Nose: Nose normal.  Mouth/Throat: Oropharynx is clear and moist. No oropharyngeal exudate.  Eyes: Conjunctivae and EOM are normal. Pupils are equal, round, and reactive to light.  Neck: Normal range of motion. Neck supple.  Cardiovascular: Normal rate, regular rhythm, normal heart sounds and intact distal pulses.  Exam reveals no  gallop and no friction rub.   No murmur heard. Pulmonary/Chest: Effort normal and breath sounds normal. No respiratory distress. He has no wheezes.  Abdominal: Soft. Bowel sounds are normal. He exhibits no distension. There is no tenderness. There is no rebound and no guarding.  Musculoskeletal: Normal range of motion. He exhibits no edema or tenderness.  Neurological: He is alert and oriented to person, place, and time.  Skin: Skin is warm and dry.  Psychiatric: He has a normal mood and affect. His behavior is normal.  Nursing note and vitals reviewed.   ED Course  Procedures (including critical care time) Labs Review Labs Reviewed  CBC  BRAIN NATRIURETIC PEPTIDE  BASIC METABOLIC PANEL  I-STAT TROPOININ, ED    Imaging Review Dg Chest 2 View (if Patient Has Fever And/or Copd)  01/23/2015   CLINICAL DATA:  Left-sided chest pain radiating to the left shoulder since Saturday. Worse today. Shortness of breath, lightheadedness, and dry heaves. Bilateral arm tingling radiating to the legs.  EXAM: CHEST  2 VIEW  COMPARISON:  12/08/2014  FINDINGS: The heart size and mediastinal contours are within normal limits. Both lungs are clear. The visualized skeletal structures are unremarkable.  IMPRESSION: No active cardiopulmonary disease.   Electronically Signed   By: Burman Nieves M.D.   On: 01/23/2015 17:05     EKG Interpretation   Date/Time:  Monday January 23 2015 16:50:46 EST Ventricular Rate:  99 PR Interval:  140 QRS Duration: 93 QT Interval:  353 QTC Calculation: 453 R Axis:   75 Text Interpretation:  Sinus rhythm Probable left atrial enlargement No  significant change since last tracing Confirmed by Swannie Milius  MD, Alver Leete  ([id number]) on 01/23/2015 8:34:56 PM      MDM   Final diagnoses:  Atypical chest pain  Anxiety    8:48 PM 29 y.o. male who presents with constant left-sided chest pain radiating to his back which began 4-5 days ago. He states that he believes the pain to be  related to anxiety and was seen here previously 2 months ago for similar symptoms. He states that his girlfriend broke up with him recently and he is facing several court dates. He did have an appointment with a psychiatrist last month but he canceled. Do not think this is a PE given similar sx in the past intermittently and ongoing anxiety/stress.   Last time he was here he was prescribed Ativan. We'll give him another prescription. His mother is with him here today and states that he has an appointment with a psychiatrist in beginning of March.  8:52 PM: I interpreted/reviewed the labs and/or imaging which were non-contributory.   I have discussed the diagnosis/risks/treatment options with the patient and caregiver and believe the pt to be eligible for discharge home to follow-up with his psychiatrist. We also discussed returning to the ED immediately if new or worsening sx occur. We discussed the sx which are most concerning (e.g., worsening pain, sob, ) that necessitate immediate return. Medications administered to the patient during their visit and any new prescriptions provided to the patient are listed below.  Medications given during this  visit Medications  LORazepam (ATIVAN) tablet 1 mg (1 mg Oral Given 01/23/15 2051)    New Prescriptions   LORAZEPAM (ATIVAN) 1 MG TABLET    Take 1 tablet (1 mg total) by mouth every 8 (eight) hours as needed for anxiety.     Purvis Sheffield, MD 01/23/15 1610  Purvis Sheffield, MD 01/23/15 [phone]

## 2015-01-23 NOTE — ED Notes (Signed)
PT reports L chest pain radiating to L shoulder since Saturday. Pain became worse today. Pt feels sob, lightheaded and has dry heaved at home. Pt states at times he has bilateral arm tingling that spreads to legs.

## 2017-08-05 ENCOUNTER — Emergency Department (HOSPITAL_BASED_OUTPATIENT_CLINIC_OR_DEPARTMENT_OTHER): Payer: No Typology Code available for payment source

## 2017-08-05 ENCOUNTER — Encounter (HOSPITAL_BASED_OUTPATIENT_CLINIC_OR_DEPARTMENT_OTHER): Payer: Self-pay | Admitting: Emergency Medicine

## 2017-08-05 ENCOUNTER — Emergency Department (HOSPITAL_BASED_OUTPATIENT_CLINIC_OR_DEPARTMENT_OTHER)
Admission: EM | Admit: 2017-08-05 | Discharge: 2017-08-05 | Disposition: A | Discharge status: Home / Self Care | Payer: No Typology Code available for payment source | Attending: Emergency Medicine | Admitting: Emergency Medicine

## 2017-08-05 DIAGNOSIS — Y939 Activity, unspecified: Secondary | ICD-10-CM | POA: Diagnosis not present

## 2017-08-05 DIAGNOSIS — F17210 Nicotine dependence, cigarettes, uncomplicated: Secondary | ICD-10-CM | POA: Diagnosis not present

## 2017-08-05 DIAGNOSIS — S299XXA Unspecified injury of thorax, initial encounter: Secondary | ICD-10-CM | POA: Diagnosis present

## 2017-08-05 DIAGNOSIS — S20212A Contusion of left front wall of thorax, initial encounter: Secondary | ICD-10-CM | POA: Diagnosis not present

## 2017-08-05 DIAGNOSIS — Y999 Unspecified external cause status: Secondary | ICD-10-CM | POA: Insufficient documentation

## 2017-08-05 DIAGNOSIS — Y92481 Parking lot as the place of occurrence of the external cause: Secondary | ICD-10-CM | POA: Insufficient documentation

## 2017-08-05 DIAGNOSIS — Z79899 Other long term (current) drug therapy: Secondary | ICD-10-CM | POA: Diagnosis not present

## 2017-08-05 LAB — URINALYSIS, ROUTINE W REFLEX MICROSCOPIC
Bilirubin Urine: NEGATIVE
Glucose, UA: NEGATIVE mg/dL
Hgb urine dipstick: NEGATIVE
Ketones, ur: NEGATIVE mg/dL
LEUKOCYTES UA: NEGATIVE
Nitrite: NEGATIVE
PROTEIN: NEGATIVE mg/dL
SPECIFIC GRAVITY, URINE: 1.014 (ref 1.005–1.030)
pH: 7.5 (ref 5.0–8.0)

## 2017-08-05 LAB — CBC
HCT: 43.8 % (ref 39.0–52.0)
HEMOGLOBIN: 14.7 g/dL (ref 13.0–17.0)
MCH: 28.7 pg (ref 26.0–34.0)
MCHC: 33.6 g/dL (ref 30.0–36.0)
MCV: 85.4 fL (ref 78.0–100.0)
PLATELETS: 301 10*3/uL (ref 150–400)
RBC: 5.13 MIL/uL (ref 4.22–5.81)
RDW: 13.6 % (ref 11.5–15.5)
WBC: 6.5 10*3/uL (ref 4.0–10.5)

## 2017-08-05 MED ORDER — HYDROCODONE-ACETAMINOPHEN 5-325 MG PO TABS: 1.0000 | ORAL_TABLET | ORAL | 0 refills | Status: DC | PRN

## 2017-08-05 MED ORDER — NAPROXEN 500 MG PO TABS
500.0000 mg | ORAL_TABLET | Freq: Two times a day (BID) | ORAL | 0 refills | Status: AC
Start: 2017-08-05 — End: 2017-08-15

## 2017-08-05 MED ORDER — METHOCARBAMOL 500 MG PO TABS: 500.0000 mg | ORAL_TABLET | Freq: Three times a day (TID) | ORAL | 0 refills | Status: DC | PRN

## 2017-08-05 MED ORDER — OXYCODONE-ACETAMINOPHEN 5-325 MG PO TABS
2.0000 | ORAL_TABLET | Freq: Once | ORAL | Status: AC
  Administered 2017-08-05: 2 via ORAL
  Filled 2017-08-05: qty 2

## 2017-08-05 MED FILL — METHOCARBAMOL 500 MG TABLET: 500 | 7 days supply | Qty: 20 | Fill #0

## 2017-08-05 MED FILL — NAPROXEN 500 MG TABLET: 500 | 10 days supply | Qty: 20 | Fill #0

## 2017-08-05 MED FILL — HYDROCODON-APAP 5-325: 5-325 | 3 days supply | Qty: 15 | Fill #0

## 2017-08-05 NOTE — ED Notes (Signed)
Patient transported to X-ray 
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
## 2017-08-05 NOTE — ED Notes (Signed)
Pt verbalized understanding of dc instructions. Pharmacy tech in chart at this time and pt unable to sign.

## 2019-08-03 ENCOUNTER — Emergency Department (HOSPITAL_COMMUNITY)
Admission: EM | Admit: 2019-08-03 | Discharge: 2019-08-03 | Disposition: A | Discharge status: Home / Self Care | Payer: Self-pay | Attending: Emergency Medicine | Admitting: Emergency Medicine

## 2019-08-03 ENCOUNTER — Other Ambulatory Visit: Payer: Self-pay

## 2019-08-03 ENCOUNTER — Encounter (HOSPITAL_COMMUNITY): Payer: Self-pay | Admitting: Emergency Medicine

## 2019-08-03 DIAGNOSIS — F17210 Nicotine dependence, cigarettes, uncomplicated: Secondary | ICD-10-CM | POA: Insufficient documentation

## 2019-08-03 DIAGNOSIS — Z79899 Other long term (current) drug therapy: Secondary | ICD-10-CM | POA: Insufficient documentation

## 2019-08-03 DIAGNOSIS — F1910 Other psychoactive substance abuse, uncomplicated: Secondary | ICD-10-CM | POA: Insufficient documentation

## 2019-08-03 MED ORDER — DICYCLOMINE HCL 20 MG PO TABS: 20.0000 mg | ORAL_TABLET | Freq: Two times a day (BID) | ORAL | 0 refills | Status: DC

## 2019-08-03 MED ORDER — DICYCLOMINE HCL 10 MG PO CAPS
10.0000 mg | ORAL_CAPSULE | Freq: Once | ORAL | Status: AC
  Administered 2019-08-03: 21:00:00 10 mg via ORAL
  Filled 2019-08-03: qty 1

## 2019-08-03 MED ORDER — CLONIDINE HCL 0.2 MG PO TABS: 0.1000 mg | ORAL_TABLET | Freq: Every day | ORAL | 0 refills | Status: DC

## 2019-08-03 MED ORDER — CLONIDINE HCL 0.1 MG PO TABS
0.1000 mg | ORAL_TABLET | Freq: Every day | ORAL | Status: DC
  Administered 2019-08-03: 0.1 mg via ORAL
  Filled 2019-08-03: qty 1

## 2019-08-03 NOTE — ED Notes (Signed)
ED Provider at bedside. 

## 2019-08-03 NOTE — ED Provider Notes (Signed)
[facility]   CSN: [id number] Arrival date & time: 08/03/19  1847    History   Chief Complaint Substance abuse  HPI Bernard Santana is a 33 y.o. male.     HPI Pt presents to the ED with complaints of substance abuse.  He has been using cocaine and fentanyl.  Last fentanyl use was 2 days ago.  He is now having body aches and chills.  No fevers.  No vomiting.  Previously was sober for a year. Pt is requesting detox.  Past Medical History:  Diagnosis Date  . GERD (gastroesophageal reflux disease)     There are no active problems to display for this patient.   Past Surgical History:  Procedure Laterality Date  . FINGER SURGERY          Home Medications    Prior to Admission medications   Medication Sig Start Date End Date Taking? Authorizing Provider  cloNIDine (CATAPRES) 0.2 MG tablet Take 0.5 tablets (0.1 mg total) by mouth daily. 08/03/19   Linwood DibblesKnapp, Lavilla Delamora, MD  diazepam (VALIUM) 5 MG tablet Take 1 tablet (5 mg total) by mouth every 12 (twelve) hours as needed for anxiety. Patient not taking: Reported on 12/08/2014 08/23/14   Hess, Nada Boozerobyn M, PA-C  dicyclomine (BENTYL) 20 MG tablet Take 1 tablet (20 mg total) by mouth 2 (two) times daily. 08/03/19   Linwood DibblesKnapp, Breeley Bischof, MD  HYDROcodone-acetaminophen (NORCO) 5-325 MG tablet Take 1 tablet by mouth every 4 (four) hours as needed for moderate pain or severe pain. 08/05/17   Shaune PollackIsaacs, Cameron, MD  LORazepam (ATIVAN) 1 MG tablet Take 1 tablet (1 mg total) by mouth every 8 (eight) hours as needed for anxiety. 01/23/15   Purvis SheffieldHarrison, Forrest, MD  methocarbamol (ROBAXIN) 500 MG tablet Take 1 tablet (500 mg total) by mouth every 8 (eight) hours as needed for muscle spasms. 08/05/17   Shaune PollackIsaacs, Cameron, MD  omeprazole (PRILOSEC) 40 MG capsule Take 40 mg by mouth 2 (two) times daily.     [provider]  traMADol (ULTRAM) 50 MG tablet Take 1 tablet (50 mg total) by mouth every 6 (six) hours as needed.  Patient not taking: Reported on 12/08/2014 08/23/14   Kathrynn SpeedHess, Robyn M, PA-C    Family History No family history on file.  Social History Social History   Tobacco Use  . Smoking status: Current Every Day Smoker    Packs/day: 1.00    Types: Cigarettes  . Smokeless tobacco: Never Used  Substance Use Topics  . Alcohol use: No  . Drug use: No     Allergies   Patient has no known allergies.   Review of Systems Review of Systems  All other systems reviewed and are negative.    Physical Exam Updated Vital Signs BP 132/86   Pulse 97   Temp 98.7 F (37.1 C) (Oral)   Resp 20   SpO2 96%   Physical Exam Vitals signs and nursing note reviewed.  Constitutional:      General: He is not in acute distress.    Appearance: He is well-developed.  HENT:     Head: Normocephalic and atraumatic.     Right Ear: External ear normal.     Left Ear: External ear normal.  Eyes:     General: No scleral icterus.       Right eye: No discharge.        Left eye: No discharge.     Conjunctiva/sclera: Conjunctivae normal.  Neck:  Musculoskeletal: Neck supple.     Trachea: No tracheal deviation.  Cardiovascular:     Rate and Rhythm: Normal rate and regular rhythm.  Pulmonary:     Effort: Pulmonary effort is normal. No respiratory distress.     Breath sounds: Normal breath sounds. No stridor. No wheezing or rales.  Abdominal:     General: Bowel sounds are normal. There is no distension.     Palpations: Abdomen is soft.     Tenderness: There is no abdominal tenderness. There is no guarding or rebound.  Musculoskeletal:        General: No tenderness.  Skin:    General: Skin is warm and dry.     Findings: No rash.  Neurological:     Mental Status: He is alert.     Cranial Nerves: No cranial nerve deficit (no facial droop, extraocular movements intact, no slurred speech).     Sensory: No sensory deficit.     Motor: No abnormal muscle tone or seizure activity.     Coordination:  Coordination normal.  Psychiatric:        Mood and Affect: Affect is not labile or tearful.        Speech: Speech is not delayed or tangential.        Behavior: Behavior is not aggressive or hyperactive.        Thought Content: Thought content does not include homicidal or suicidal ideation.      ED Treatments / Results   Procedures Procedures (including critical care time)  Medications Ordered in ED Medications  dicyclomine (BENTYL) capsule 10 mg (has no administration in time range)  cloNIDine (CATAPRES) tablet 0.1 mg (has no administration in time range)     Initial Impression / Assessment and Plan / ED Course  I have reviewed the triage vital signs and the nursing notes.  Pertinent labs & imaging results that were available during my care of the patient were reviewed by me and considered in my medical decision making (see chart for details).   Patient presents the ED with complaints of substance abuse.  Patient is seeking assistance in getting clean and sober.  Patient is medically stable.  He is afebrile.  He is not vomiting is not tachycardic or tremulous.  Patient is not suicidal homicidal.  I will provide him with outpatient resources regarding treatment for his substance abuse.  Also give him a prescription for Bentyl and clonidine to help with opiate withdrawal symptoms.  Final Clinical Impressions(s) / ED Diagnoses   Final diagnoses:  Polysubstance abuse [facility])    ED Discharge Orders         Ordered    dicyclomine (BENTYL) 20 MG tablet  2 times daily     08/03/19 2016    cloNIDine (CATAPRES) 0.2 MG tablet  Daily     08/03/19 2016           Dorie Rank, MD 08/03/19 2017

## 2019-08-03 NOTE — ED Triage Notes (Signed)
Patient requesting detox from crack cocaine and fentanyl. States last use crack today and fentanyl x2 days ago. States drinks beer daily. Denies SI/HI.

## 2019-08-03 NOTE — Discharge Instructions (Addendum)
Please review the discharge instructions to help you with finding treatment center resources for your substance abuse, take the medications to help with any withdrawal symptoms

## 2019-09-30 ENCOUNTER — Encounter (HOSPITAL_BASED_OUTPATIENT_CLINIC_OR_DEPARTMENT_OTHER): Payer: Self-pay | Admitting: Emergency Medicine

## 2019-09-30 ENCOUNTER — Other Ambulatory Visit: Payer: Self-pay

## 2019-09-30 ENCOUNTER — Emergency Department (HOSPITAL_BASED_OUTPATIENT_CLINIC_OR_DEPARTMENT_OTHER)
Admission: EM | Admit: 2019-09-30 | Discharge: 2019-09-30 | Disposition: A | Discharge status: Home / Self Care | Payer: Self-pay | Attending: Emergency Medicine | Admitting: Emergency Medicine

## 2019-09-30 DIAGNOSIS — Z79899 Other long term (current) drug therapy: Secondary | ICD-10-CM | POA: Insufficient documentation

## 2019-09-30 DIAGNOSIS — R338 Other retention of urine: Secondary | ICD-10-CM

## 2019-09-30 DIAGNOSIS — R102 Pelvic and perineal pain: Secondary | ICD-10-CM | POA: Insufficient documentation

## 2019-09-30 DIAGNOSIS — R339 Retention of urine, unspecified: Secondary | ICD-10-CM | POA: Insufficient documentation

## 2019-09-30 DIAGNOSIS — F17210 Nicotine dependence, cigarettes, uncomplicated: Secondary | ICD-10-CM | POA: Insufficient documentation

## 2019-09-30 HISTORY — DX: Other psychoactive substance abuse, uncomplicated: F19.10

## 2019-09-30 MED ORDER — BETHANECHOL CHLORIDE 25 MG PO TABS: ORAL_TABLET | ORAL | 0 refills | Status: AC

## 2019-09-30 NOTE — ED Triage Notes (Signed)
Pt states he has not been able to urinate x 15 hours.

## 2019-09-30 NOTE — ED Notes (Signed)
ED Provider at bedside. 

## 2019-09-30 NOTE — ED Provider Notes (Signed)
[facility] Provider Note: Lowella Dell, MD, FACEP  CSN: [id number] MRN: [id number] ARRIVAL: 09/30/19 at 0542 ROOM: MH02/MH02   CHIEF COMPLAINT  Urinary Retention   HISTORY OF PRESENT ILLNESS  09/30/19 5:53 AM Bernard Santana is a 33 y.o. male who complains of not being able to urinate for about the last 15 hours.  He was able to void a small amount about 3 AM today.  He is having suprapubic pain which he rates as an 8 out of 10.  Prior to his urinary retention he denies any fever, chills, dysuria, penile pain, urethral discharge.  He denies any recent illicit drug use but does admit to taking 3 Benadryl tablets yesterday.  It is noted he has a history of polysubstance abuse including ED visits in August for sedative/hypnotic drug withdrawal, polysubstance abuse and cocaine abuse.   Past Medical History:  Diagnosis Date   GERD (gastroesophageal reflux disease)    Polysubstance abuse (HCC)     Past Surgical History:  Procedure Laterality Date   FINGER SURGERY      No family history on file.  Social History   Tobacco Use   Smoking status: Current Every Day Smoker    Packs/day: 1.00    Types: Cigarettes   Smokeless tobacco: Never Used  Substance Use Topics   Alcohol use: No   Drug use: Yes    Types: Cocaine    Comment: heroin, fentanyl    Prior to Admission medications   Medication Sig Start Date End Date Taking? Authorizing Provider  omeprazole (PRILOSEC) 40 MG capsule Take 40 mg by mouth 2 (two) times daily.    Yes [provider]  bethanechol (URECHOLINE) 25 MG tablet Take 1/2 tablet every hour until able to empty bladder.  Repeat this process every 6 hours as needed. 09/30/19   Kimya Mccahill, Jonny Ruiz, MD    Allergies Patient has no known allergies.   REVIEW OF SYSTEMS  Negative except as noted here or in the History of Present Illness.   PHYSICAL EXAMINATION  Initial Vital Signs Blood pressure 134/88, pulse 82, temperature 98 F (36.7  C), temperature source Oral, resp. rate 16, height 6' (1.829 m), weight 90.7 kg, SpO2 97 %.  Examination General: Well-developed, well-nourished male in no acute distress; appearance consistent with age of record HENT: normocephalic; atraumatic Eyes: pupils equal, round and reactive to light; extraocular muscles intact Neck: supple Heart: regular rate and rhythm Lungs: clear to auscultation bilaterally Abdomen: soft; nondistended; mildly tender, distended bladder (about 400 mL on bedside bladder scan); bowel sounds present Extremities: No deformity; full range of motion; pulses normal Neurologic: Awake, alert and oriented; motor function intact in all extremities and symmetric; no facial droop Skin: Warm and dry Psychiatric: Normal mood and affect   RESULTS  Summary of this visit's results, reviewed by myself:   EKG Interpretation  Date/Time:    Ventricular Rate:    PR Interval:    QRS Duration:   QT Interval:    QTC Calculation:   R Axis:     Text Interpretation:        Laboratory Studies: No results found for this or any previous visit (from the past 24 hour(s)). Imaging Studies: No results found.  ED COURSE and MDM  Nursing notes and initial vitals signs, including pulse oximetry, reviewed.  Vitals:   09/30/19 0549 09/30/19 0552  BP:  134/88  Pulse:  82  Resp:  16  Temp:  98 F (36.7 C)  TempSrc:  Oral  SpO2:  97%  Weight: 90.7 kg   Height: 6' (1.829 m)    6:27 AM Patient refuses urethral catheterization.  He continues to deny any illicit drug use including cocaine or anticholinergic such as angel trumpets.  He has taken Ultram for neck pain.  As noted above he has taken Benadryl yesterday.  We will trial bethanechol but advised him he may need to return for catheterization if symptoms persist.  PROCEDURES    ED DIAGNOSES     ICD-10-CM   1. Acute urinary retention  R33.8        Shanon Rosser, MD 09/30/19 [phone]

## 2019-09-30 NOTE — ED Notes (Signed)
Bladder scanner showed 200 ml in bladder.

## 2019-09-30 NOTE — ED Notes (Signed)
Pt refuses to have catheter inserted, unable to give urine sample and asking for a work note.

## 2019-10-01 ENCOUNTER — Other Ambulatory Visit: Payer: Self-pay

## 2019-10-01 ENCOUNTER — Emergency Department (HOSPITAL_BASED_OUTPATIENT_CLINIC_OR_DEPARTMENT_OTHER)
Admission: EM | Admit: 2019-10-01 | Discharge: 2019-10-01 | Disposition: A | Discharge status: Home / Self Care | Payer: Self-pay | Attending: Emergency Medicine | Admitting: Emergency Medicine

## 2019-10-01 ENCOUNTER — Encounter (HOSPITAL_BASED_OUTPATIENT_CLINIC_OR_DEPARTMENT_OTHER): Payer: Self-pay

## 2019-10-01 DIAGNOSIS — L0390 Cellulitis, unspecified: Secondary | ICD-10-CM

## 2019-10-01 DIAGNOSIS — Z79899 Other long term (current) drug therapy: Secondary | ICD-10-CM | POA: Insufficient documentation

## 2019-10-01 DIAGNOSIS — H6012 Cellulitis of left external ear: Secondary | ICD-10-CM | POA: Insufficient documentation

## 2019-10-01 DIAGNOSIS — F17210 Nicotine dependence, cigarettes, uncomplicated: Secondary | ICD-10-CM | POA: Insufficient documentation

## 2019-10-01 MED ORDER — CLINDAMYCIN HCL 150 MG PO CAPS: 450.0000 mg | ORAL_CAPSULE | Freq: Three times a day (TID) | ORAL | 0 refills | Status: AC

## 2019-10-01 NOTE — ED Provider Notes (Signed)
[facility] EMERGENCY DEPARTMENT Provider Note   CSN: [id number] Arrival date & time: 10/01/19  1052     History   Chief Complaint No chief complaint on file.   HPI Bernard Santana is a 33 y.o. male.     HPI  Patient is a 33 year old male with a history of GERD, polysubstance abuse, who presents to the emergency department today for evaluation of left ear pain.  States he woke up in the middle of the night and his left ear was swollen and painful.  He has an open wound to the upper part of the ear as well.  He states he also has several scabs and areas of redness to his chest.  He denies any documented fevers at home.  Denies associated symptoms.  He denies any recent IVDU, states that he has not used drugs for 2 years.  Past Medical History:  Diagnosis Date  . GERD (gastroesophageal reflux disease)   . Polysubstance abuse (HCC)     There are no active problems to display for this patient.   Past Surgical History:  Procedure Laterality Date  . FINGER SURGERY          Home Medications    Prior to Admission medications   Medication Sig Start Date End Date Taking? Authorizing Provider  bethanechol (URECHOLINE) 25 MG tablet Take 1/2 tablet every hour until able to empty bladder.  Repeat this process every 6 hours as needed. 09/30/19   Molpus, John, MD  clindamycin (CLEOCIN) 150 MG capsule Take 3 capsules (450 mg total) by mouth 3 (three) times daily for 7 days. 10/01/19 10/08/19  Rolanda Campa S, PA-C  omeprazole (PRILOSEC) 40 MG capsule Take 40 mg by mouth 2 (two) times daily.     [provider]    Family History History reviewed. No pertinent family history.  Social History Social History   Tobacco Use  . Smoking status: Current Every Day Smoker    Packs/day: 1.00    Types: Cigarettes  . Smokeless tobacco: Never Used  Substance Use Topics  . Alcohol use: No  . Drug use: Yes    Types: Cocaine    Comment: heroin, fentanyl      Allergies   Patient has no known allergies.   Review of Systems Review of Systems  Constitutional: Negative for fever.  HENT:       Left ear pain  Respiratory: Negative for shortness of breath.   Cardiovascular: Negative for chest pain.  Skin: Positive for color change and wound.     Physical Exam Updated Vital Signs BP 124/68 (BP Location: Right Arm)   Pulse 78   Temp 98.5 F (36.9 C) (Oral)   Resp 14   Ht 6' (1.829 m)   Wt 90.7 kg   SpO2 100%   BMI 27.12 kg/m   Physical Exam Vitals signs and nursing note reviewed.  Constitutional:      Appearance: He is well-developed.  HENT:     Head: Normocephalic and atraumatic.     Ears:     Comments: Left auricle is erythematous and swollen to the upper portion.  There is a scab to the auricle as well without any purulent drainage.  There is induration and warmth to the left auricle without any obvious fluctuance. Eyes:     Conjunctiva/sclera: Conjunctivae normal.  Neck:     Musculoskeletal: Neck supple.  Cardiovascular:     Rate and Rhythm: Normal rate and regular rhythm.  Pulmonary:  Effort: Pulmonary effort is normal.  Abdominal:     Palpations: Abdomen is soft.  Musculoskeletal:     Comments: Several 2 cm areas to the chest with central scabbing.  There is surrounding erythema to these areas.  No fluctuance noted.  Minimal drainage to 1 of the wounds noted.  Picture is below.  Skin:    General: Skin is warm and dry.  Neurological:     Mental Status: He is alert.           ED Treatments / Results  Labs (all labs ordered are listed, but only abnormal results are displayed) Labs Reviewed - No data to display  EKG None  Radiology No results found.  Procedures Procedures (including critical care time)  Medications Ordered in ED Medications - No data to display   Initial Impression / Assessment and Plan / ED Course  I have reviewed the triage vital signs and the nursing notes.  Pertinent labs  & imaging results that were available during my care of the patient were reviewed by me and considered in my medical decision making (see chart for details).     Final Clinical Impressions(s) / ED Diagnoses   Final diagnoses:  Cellulitis, unspecified cellulitis site   Patient is a 33 year old male with a history of GERD, polysubstance abuse, who presents to the emergency department today for evaluation of left ear pain.  States he woke up in the middle of the night and his left ear was swollen and painful.  He has an open wound to the upper part of the ear as well.  He states he also has several scabs and areas of redness to his chest.  He denies any documented fevers at home.  Denies associated symptoms.  He denies any recent IVDU, states that he has not used drugs for 2 years.  On exam, he does appear to have cellulitis to the left auricle.  He also has several small areas of scabbing to the chest with mild amounts of erythema as well.  I question whether patient is having insect bites and told him to check the furniture in his home.  I will start him on antibiotics.  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  Will cover for this.  Have advised him to follow-up in 48 hours for wound recheck and return sooner for any signs worsening infection.  He voices understanding of plan and reasons to return.  Questions answered.  Patient stable discharge.  ED Discharge Orders         Ordered    clindamycin (CLEOCIN) 150 MG capsule  3 times daily     10/01/19 [address] Annett Boxwell S, PA-C 10/01/19 [place], [place], DO 10/01/19 1349

## 2019-10-01 NOTE — ED Triage Notes (Signed)
Pt states began having several raised bumps torso and left ear, today ear reddened and painful and swollen

## 2019-10-01 NOTE — Discharge Instructions (Signed)
You were given a prescription for antibiotics. Please take the antibiotic prescription fully.   Follow-up in the ED for wound recheck in 48 hours.  You were also given information to follow-up with an ear nose and throat doctor.  Please call the office schedule an appointment for follow-up.  Please return to the emergency room immediately if you experience any new or worsening symptoms or any symptoms that indicate worsening infection such as fevers, increased redness/swelling/pain, warmth, or drainage from the affected area.

## 2019-11-18 ENCOUNTER — Encounter (HOSPITAL_BASED_OUTPATIENT_CLINIC_OR_DEPARTMENT_OTHER): Payer: Self-pay | Admitting: Emergency Medicine

## 2019-11-18 ENCOUNTER — Emergency Department (HOSPITAL_BASED_OUTPATIENT_CLINIC_OR_DEPARTMENT_OTHER): Payer: Self-pay

## 2019-11-18 ENCOUNTER — Other Ambulatory Visit: Payer: Self-pay

## 2019-11-18 ENCOUNTER — Emergency Department (HOSPITAL_BASED_OUTPATIENT_CLINIC_OR_DEPARTMENT_OTHER)
Admission: EM | Admit: 2019-11-18 | Discharge: 2019-11-18 | Disposition: A | Discharge status: Home / Self Care | Payer: Self-pay | Attending: Emergency Medicine | Admitting: Emergency Medicine

## 2019-11-18 DIAGNOSIS — Z20828 Contact with and (suspected) exposure to other viral communicable diseases: Secondary | ICD-10-CM | POA: Insufficient documentation

## 2019-11-18 DIAGNOSIS — Z79899 Other long term (current) drug therapy: Secondary | ICD-10-CM | POA: Insufficient documentation

## 2019-11-18 DIAGNOSIS — F17210 Nicotine dependence, cigarettes, uncomplicated: Secondary | ICD-10-CM | POA: Insufficient documentation

## 2019-11-18 DIAGNOSIS — J069 Acute upper respiratory infection, unspecified: Secondary | ICD-10-CM | POA: Insufficient documentation

## 2019-11-18 LAB — SARS CORONAVIRUS 2 AG (30 MIN TAT): SARS Coronavirus 2 Ag: NEGATIVE

## 2019-11-18 NOTE — ED Triage Notes (Addendum)
Sob x 1 week.  Runny nose, sneezing and productive cough since Saturday.  Denies fever.  Denies exposure to covid.  Generalized body aches.  Pt lives in group house.

## 2019-11-18 NOTE — ED Provider Notes (Signed)
[facility] EMERGENCY DEPARTMENT Provider Note   CSN: [id number] Arrival date & time: 11/18/19  0935     History   Chief Complaint Chief Complaint  Patient presents with  . URI    HPI Bernard Santana is a 33 y.o. male.     The history is provided by the patient.  URI Presenting symptoms: congestion and cough   Presenting symptoms: no ear pain, no fever and no sore throat   Severity:  Mild Onset quality:  Gradual Timing:  Intermittent Progression:  Waxing and waning Chronicity:  New Relieved by:  Nothing Worsened by:  Nothing Associated symptoms: myalgias and sinus pain   Associated symptoms: no arthralgias   Risk factors: no sick contacts     Past Medical History:  Diagnosis Date  . GERD (gastroesophageal reflux disease)   . Polysubstance abuse (HCC)     There are no active problems to display for this patient.   Past Surgical History:  Procedure Laterality Date  . FINGER SURGERY          Home Medications    Prior to Admission medications   Medication Sig Start Date End Date Taking? Authorizing Provider  bethanechol (URECHOLINE) 25 MG tablet Take 1/2 tablet every hour until able to empty bladder.  Repeat this process every 6 hours as needed. 09/30/19   Molpus, John, MD  omeprazole (PRILOSEC) 40 MG capsule Take 40 mg by mouth 2 (two) times daily.     [provider]    Family History History reviewed. No pertinent family history.  Social History Social History   Tobacco Use  . Smoking status: Current Every Day Smoker    Packs/day: 1.00    Types: Cigarettes  . Smokeless tobacco: Never Used  Substance Use Topics  . Alcohol use: No  . Drug use: Not Currently    Types: Cocaine    Comment: heroin, fentanyl     Allergies   Patient has no known allergies.   Review of Systems Review of Systems  Constitutional: Negative for chills and fever.  HENT: Positive for congestion and sinus pain. Negative for ear pain and sore  throat.   Eyes: Negative for pain and visual disturbance.  Respiratory: Positive for cough. Negative for shortness of breath.   Cardiovascular: Negative for chest pain and palpitations.  Gastrointestinal: Negative for abdominal pain and vomiting.  Genitourinary: Negative for dysuria and hematuria.  Musculoskeletal: Positive for myalgias. Negative for arthralgias and back pain.  Skin: Negative for color change and rash.  Neurological: Negative for seizures and syncope.  All other systems reviewed and are negative.    Physical Exam Updated Vital Signs BP 137/80 (BP Location: Right Arm)   Pulse 85   Temp 98.7 F (37.1 C) (Oral)   Resp 19   Ht 6' (1.829 m)   Wt 90.7 kg   SpO2 100%   BMI 27.12 kg/m   Physical Exam Vitals signs and nursing note reviewed.  Constitutional:      General: He is not in acute distress.    Appearance: He is well-developed. He is not ill-appearing.  HENT:     Head: Normocephalic and atraumatic.     Right Ear: Tympanic membrane normal.     Left Ear: Tympanic membrane normal.     Nose: Nose normal.     Mouth/Throat:     Mouth: Mucous membranes are moist.  Eyes:     Extraocular Movements: Extraocular movements intact.     Conjunctiva/sclera: Conjunctivae normal.  Pupils: Pupils are equal, round, and reactive to light.  Neck:     Musculoskeletal: Normal range of motion and neck supple.  Cardiovascular:     Rate and Rhythm: Normal rate and regular rhythm.     Pulses: Normal pulses.     Heart sounds: Normal heart sounds. No murmur.  Pulmonary:     Effort: Pulmonary effort is normal. No respiratory distress.     Breath sounds: Normal breath sounds.  Abdominal:     General: There is no distension.     Palpations: Abdomen is soft.     Tenderness: There is no abdominal tenderness.  Musculoskeletal: Normal range of motion.  Skin:    General: Skin is warm and dry.     Capillary Refill: Capillary refill takes less than 2 seconds.  Neurological:      General: No focal deficit present.     Mental Status: He is alert.  Psychiatric:        Mood and Affect: Mood normal.      ED Treatments / Results  Labs (all labs ordered are listed, but only abnormal results are displayed) Labs Reviewed  SARS CORONAVIRUS 2 AG (30 MIN TAT)  NOVEL CORONAVIRUS, NAA (HOSP ORDER, SEND-OUT TO REF LAB; TAT 18-24 HRS)    EKG None  Radiology Dg Chest Portable 1 View  Result Date: 11/18/2019 CLINICAL DATA:  Sob x 1 week. Runny nose, sneezing and productive cough since Saturday. Denies fever. Denies exposure to covid. Generalized body aches. Pt lives in group house. smoker EXAM: PORTABLE CHEST 1 VIEW COMPARISON:  08/05/2017. FINDINGS: The heart size and mediastinal contours are within normal limits. Both lungs are clear. No pleural effusion or pneumothorax. The visualized skeletal structures are unremarkable. IMPRESSION: No active disease. Electronically Signed   By: Lajean Manes M.D.   On: 11/18/2019 10:58    Procedures Procedures (including critical care time)  Medications Ordered in ED Medications - No data to display   Initial Impression / Assessment and Plan / ED Course  I have reviewed the triage vital signs and the nursing notes.  Pertinent labs & imaging results that were available during my care of the patient were reviewed by me and considered in my medical decision making (see chart for details).        Bernard Santana is a 33 year old male who presents to the ED with flulike symptoms.  Normal vitals.  No fever.  No signs of respiratory distress.  Clear breath sounds.  No signs of pneumonia on chest x-ray.  No signs of ear or throat infection on exam.  Possibly Covid virus.  Rapid test was negative.  Confirmatory test has been ordered.  Recommend self-isolation at home.  Recommend Tylenol, Motrin.  Given return precautions and discharged from ED in good condition.  Bernard Santana was evaluated in Emergency Department on 11/18/2019 for the  symptoms described in the history of present illness. He was evaluated in the context of the global COVID-19 pandemic, which necessitated consideration that the patient might be at risk for infection with the SARS-CoV-2 virus that causes COVID-19. Institutional protocols and algorithms that pertain to the evaluation of patients at risk for COVID-19 are in a state of rapid change based on information released by regulatory bodies including the CDC and federal and state organizations. These policies and algorithms were followed during the patient's care in the ED.   Final Clinical Impressions(s) / ED Diagnoses   Final diagnoses:  Upper respiratory tract infection, unspecified type  ED Discharge Orders    None       Virgina Norfolk, DO 11/18/19 1200

## 2019-11-18 NOTE — Discharge Instructions (Signed)
Your rapid Covid test was negative however we have also performed a confirmatory test that will take 1 to 2 days to come back.  Please self isolate until you hear back about those results.  Chest x-ray showed no infection.  Continue Tylenol Motrin as needed.

## 2019-11-19 LAB — NOVEL CORONAVIRUS, NAA (HOSP ORDER, SEND-OUT TO REF LAB; TAT 18-24 HRS): SARS-CoV-2, NAA: NOT DETECTED

## 2020-02-14 DEATH — deceased
